# Patient Record
Sex: Male | Born: 1950 | Race: Black or African American | Hispanic: No | Smoking: Never smoker
Health system: Southern US, Community
[De-identification: ages and names within clinical notes are randomized; demographics above are authoritative.]

## PROBLEM LIST (undated history)

## (undated) DIAGNOSIS — I10 Essential (primary) hypertension: Secondary | ICD-10-CM

## (undated) DIAGNOSIS — I509 Heart failure, unspecified: Secondary | ICD-10-CM

## (undated) DIAGNOSIS — E119 Type 2 diabetes mellitus without complications: Secondary | ICD-10-CM

---

## 2015-02-05 ENCOUNTER — Emergency Department (HOSPITAL_COMMUNITY)
Admission: EM | Admit: 2015-02-05 | Discharge: 2015-02-05 | Disposition: A | Discharge status: Home / Self Care | Payer: Self-pay | Attending: Emergency Medicine | Admitting: Emergency Medicine

## 2015-02-05 ENCOUNTER — Encounter (HOSPITAL_COMMUNITY): Payer: Self-pay | Admitting: Adult Health

## 2015-02-05 ENCOUNTER — Emergency Department (HOSPITAL_COMMUNITY): Payer: Self-pay

## 2015-02-05 DIAGNOSIS — Z794 Long term (current) use of insulin: Secondary | ICD-10-CM | POA: Insufficient documentation

## 2015-02-05 DIAGNOSIS — I10 Essential (primary) hypertension: Secondary | ICD-10-CM | POA: Insufficient documentation

## 2015-02-05 DIAGNOSIS — Z7982 Long term (current) use of aspirin: Secondary | ICD-10-CM | POA: Insufficient documentation

## 2015-02-05 DIAGNOSIS — Z79899 Other long term (current) drug therapy: Secondary | ICD-10-CM | POA: Insufficient documentation

## 2015-02-05 DIAGNOSIS — R51 Headache: Secondary | ICD-10-CM | POA: Insufficient documentation

## 2015-02-05 DIAGNOSIS — E119 Type 2 diabetes mellitus without complications: Secondary | ICD-10-CM | POA: Insufficient documentation

## 2015-02-05 DIAGNOSIS — I509 Heart failure, unspecified: Secondary | ICD-10-CM | POA: Insufficient documentation

## 2015-02-05 DIAGNOSIS — R519 Headache, unspecified: Secondary | ICD-10-CM

## 2015-02-05 HISTORY — DX: Heart failure, unspecified: I50.9

## 2015-02-05 HISTORY — DX: Type 2 diabetes mellitus without complications: E11.9

## 2015-02-05 HISTORY — DX: Essential (primary) hypertension: I10

## 2015-02-05 MED ORDER — MORPHINE SULFATE 4 MG/ML IJ SOLN
4.0000 mg | Freq: Once | INTRAMUSCULAR | Status: AC
  Administered 2015-02-05: 4 mg via INTRAVENOUS
  Filled 2015-02-05: qty 1

## 2015-02-05 NOTE — ED Provider Notes (Signed)
CSN: [id number]     Arrival date & time 02/05/15  2054 History   First MD Initiated Contact with Patient 02/05/15 2121     Chief Complaint  Patient presents with  . Headache     (Consider location/radiation/quality/duration/timing/severity/associated sxs/prior Treatment) HPI  Pt is a 64yo male with hx of HTN, DM, and CHF, presenting to ED with c/o sudden onset "thunderclap" headache that started around 8:45PM while visiting a family member. Pt states pain is constant, aching and sharp, was 10/10 at first but now 8/10 w/o pain medication. Pt states he does not have a hx of migraines and has never had a HA like this before. Denies recent falls or head trauma. Pt is on daily aspirin but no other blood thinners. No recent cough, congestion, fever or chills. Pt denies n/v/d.  Denies hx of seizures.    Past Medical History  Diagnosis Date  . Hypertension   . Diabetes mellitus without complication   . CHF (congestive heart failure)    History reviewed. No pertinent past surgical history. History reviewed. No pertinent family history. History  Substance Use Topics  . Smoking status: Never Smoker   . Smokeless tobacco: Not on file  . Alcohol Use: No    Review of Systems  Constitutional: Negative for fever and chills.  Eyes: Negative for photophobia, pain and visual disturbance.  Respiratory: Negative for cough and shortness of breath.   Cardiovascular: Negative for chest pain, palpitations and leg swelling.  Gastrointestinal: Negative for nausea, vomiting and abdominal pain.  Neurological: Positive for headaches. Negative for dizziness, tremors, seizures, syncope, facial asymmetry, speech difficulty, weakness, light-headedness and numbness.  All other systems reviewed and are negative.     Allergies  Review of patient's allergies indicates no known allergies.  Home Medications   Prior to Admission medications   Medication Sig Start Date End Date Taking? Authorizing Provider   acetaminophen (TYLENOL) 500 MG tablet Take 500 mg by mouth every 6 (six) hours as needed for headache.   Yes Historical Provider, MD  allopurinol (ZYLOPRIM) 100 MG tablet Take 100 mg by mouth every morning.   Yes Historical Provider, MD  aspirin 81 MG chewable tablet Chew 81 mg by mouth daily. 11/13/14  Yes Historical Provider, MD  carvedilol (COREG) 12.5 MG tablet Take 12.5 mg by mouth 2 (two) times daily. 11/13/14  Yes Historical Provider, MD  ferrous sulfate 325 (65 FE) MG tablet Take 1 tablet by mouth 2 (two) times daily.   Yes Historical Provider, MD  glipiZIDE (GLUCOTROL) 10 MG tablet Take 10 mg by mouth 2 (two) times daily.   Yes Historical Provider, MD  hydrALAZINE (APRESOLINE) 50 MG tablet Take 50 mg by mouth 3 (three) times daily. 05/21/13  Yes Historical Provider, MD  insulin aspart (NOVOLOG) 100 UNIT/ML injection Inject 0-10 Units into the skin 3 (three) times daily before meals.   Yes Historical Provider, MD  insulin glargine (LANTUS) 100 UNIT/ML injection Inject 10 Units into the skin at bedtime.   Yes Historical Provider, MD  isosorbide mononitrate (IMDUR) 60 MG 24 hr tablet Take 60 mg by mouth every morning.   Yes Historical Provider, MD  nitroGLYCERIN (NITROSTAT) 0.4 MG SL tablet Take 0.4 mg by mouth every 5 (five) minutes x 3 doses as needed. 11/11/14  Yes Historical Provider, MD  Omeprazole 20 MG TBEC Take 20 mg by mouth daily.   Yes Historical Provider, MD  traMADol (ULTRAM) 50 MG tablet Take 100 mg by mouth every 6 (six) hours as  needed for severe pain.   Yes Historical Provider, MD  Vitamin D, Ergocalciferol, (DRISDOL) 50000 UNITS CAPS capsule Take 50,000 Units by mouth 2 (two) times a week. Takes on mon and fri   Yes Historical Provider, MD   BP 142/78 mmHg  Pulse 72  Temp(Src) 97.9 F (36.6 C) (Oral)  Resp 15  SpO2 98% Physical Exam  Constitutional: He is oriented to person, place, and time. He appears well-developed and well-nourished.  Pt lying in exam bed, NAD. Eyes  closed.  HENT:  Head: Normocephalic and atraumatic.  Eyes: Conjunctivae and EOM are normal. Pupils are equal, round, and reactive to light. No scleral icterus.  Neck: Normal range of motion. Neck supple.  No nuchal rigidity or meningeal signs.  Cardiovascular: Normal rate, regular rhythm and normal heart sounds.   Pulmonary/Chest: Effort normal and breath sounds normal. No respiratory distress. He has no wheezes. He has no rales. He exhibits no tenderness.  Abdominal: Soft. Bowel sounds are normal. He exhibits no distension and no mass. There is no tenderness. There is no rebound and no guarding.  Musculoskeletal: Normal range of motion.  Neurological: He is alert and oriented to person, place, and time. He has normal strength. No cranial nerve deficit or sensory deficit. Coordination normal. GCS eye subscore is 4. GCS verbal subscore is 5. GCS motor subscore is 6.  CN II-XII in tact. Speech is fluent and clear, able to follow 2 step commands. Sensation symmetric in all extremities bilaterally. 5/5 strength in upper and lower extremities bilaterally.   Skin: Skin is warm and dry.  Nursing note and vitals reviewed.   ED Course  Procedures (including critical care time) Labs Review Labs Reviewed - No data to display  Imaging Review Ct Head Wo Contrast  02/05/2015   CLINICAL DATA:  Severe posterior headache beginning 20 minutes ago. History of hypertension, diabetes and CHF.  EXAM: CT HEAD WITHOUT CONTRAST  TECHNIQUE: Contiguous axial images were obtained from the base of the skull through the vertex without intravenous contrast.  COMPARISON:  None.  FINDINGS: The ventricles and sulci are normal for age. No intraparenchymal hemorrhage, mass effect nor midline shift. Patchy supratentorial white matter hypodensities are within normal range for patient's age and though non-specific suggest sequelae of chronic small vessel ischemic disease. No acute large vascular territory infarcts. Chronic  appearing small RIGHT cerebellar infarct.  No abnormal extra-axial fluid collections. Basal cisterns are patent. Mild calcific atherosclerosis of the carotid siphons.  No skull fracture. The included ocular globes and orbital contents are non-suspicious. The mastoid aircells and included paranasal sinuses are well-aerated.  IMPRESSION: No acute intracranial process.  Mild white matter changes suggest chronic small vessel ischemic disease. Chronic appearing small RIGHT cerebellar infarct.   Electronically Signed   By: Awilda Metro   On: 02/05/2015 21:59     EKG Interpretation None      MDM   Final diagnoses:  Generalized headache   Pt is a 64yo male presenting to ED with c/o sudden onset "thunderclap" headache at 8:45PM.  Pt was in hospital visiting a family member. Denies recent head trauma. Vitals: WNL. No focal neuro deficit on exam. Concern for [facility].   CT head: no acute intracranial process. Pt was given IV  morphine. HA improved significantly from 10/10 to 3/10 Pt was able to ambulate around ED w/o difficulty. Still no focal neuro deficit.  Discussed pt with Dr. Anitra Lauth, no evidence of emergent process taking place at this time. No indication for additional  work up. Pt may be discharged home to f/u with PCP. Return precautions provided. Pt verbalized understanding and agreement with tx plan.   CT head:   Junius Finnerrin O'Malley, PA-C 02/05/15 2305  Gwyneth SproutWhitney Plunkett, MD 02/06/15 [phone]

## 2015-02-05 NOTE — ED Notes (Signed)
Presents with sudden onset of headache-described as "thunderclap in head" no neuro deficits at this time-alert and oriented- pt has equal reactive pupils. BP WNL.  Headache come on at 8:45 pm this evening.

## 2016-12-12 IMAGING — CT CT HEAD W/O CM
2 series · 15 of 30 positions shown, 19 images · non-contrast
Comparison: None.

CLINICAL DATA: Severe posterior headache beginning 20 minutes ago.
History of hypertension, diabetes and CHF.

EXAM:
CT HEAD WITHOUT CONTRAST
TECHNIQUE: Contiguous axial images were obtained from the base of the skull
through the vertex without intravenous contrast.

[Series 201: head w/o, idose (1) · axial · non-contrast · 0.49mm/px · z∈[+68,+198]mm · 13 of 32 slices shown, 17 images]
[im 3/32  brain]
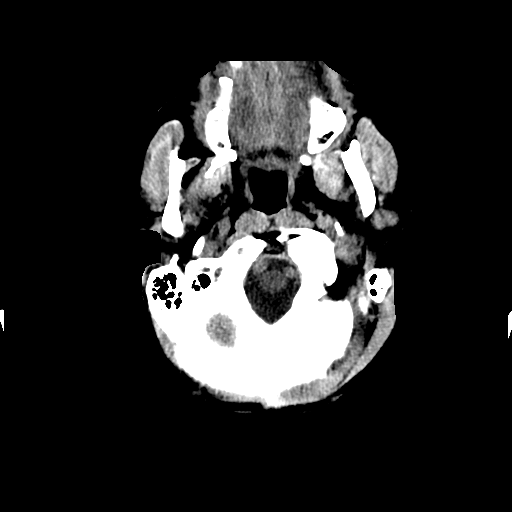
[im 3/32  bone]
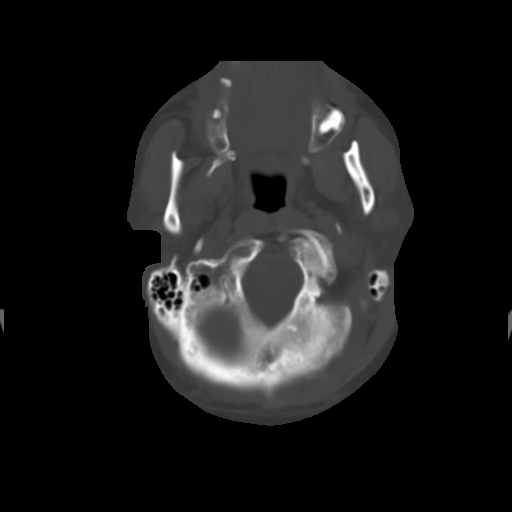
[im 5/32  brain]
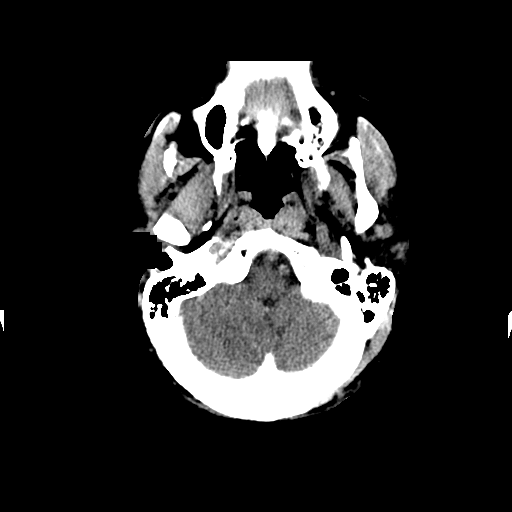
[im 7/32  brain]
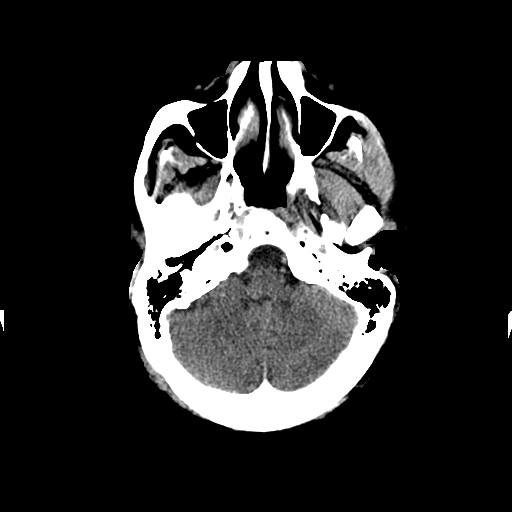
[im 9/32  brain]
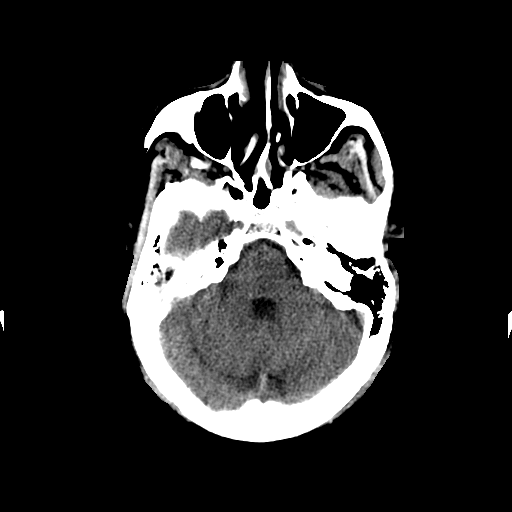
[im 12/32  brain]
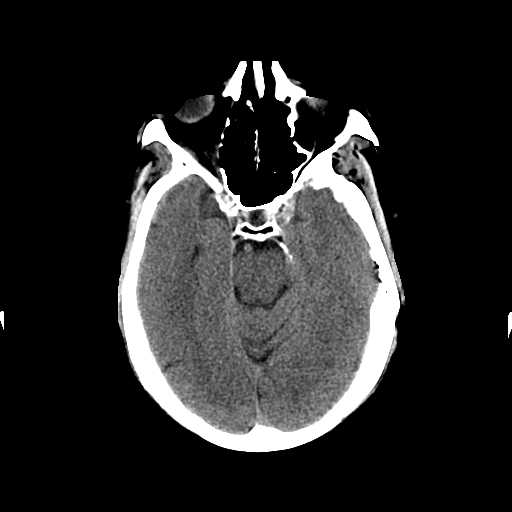
[im 12/32  bone]
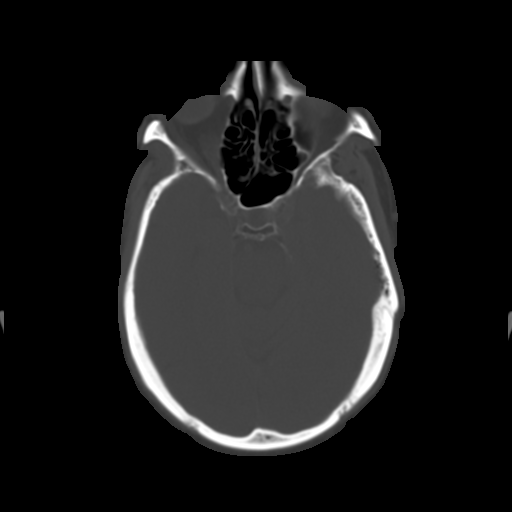
[im 14/32  brain]
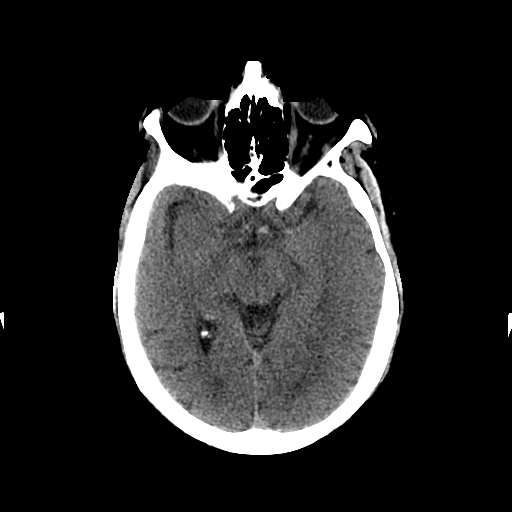
[im 16/32  brain]
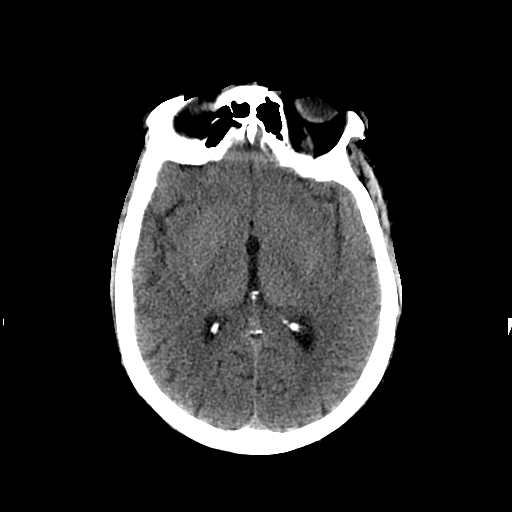
[im 18/32  brain]
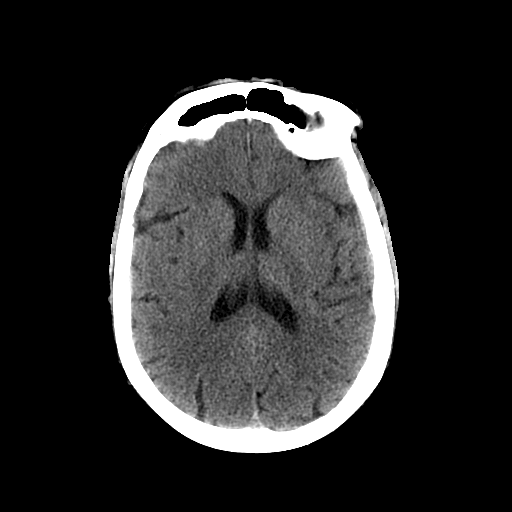
[im 20/32  brain]
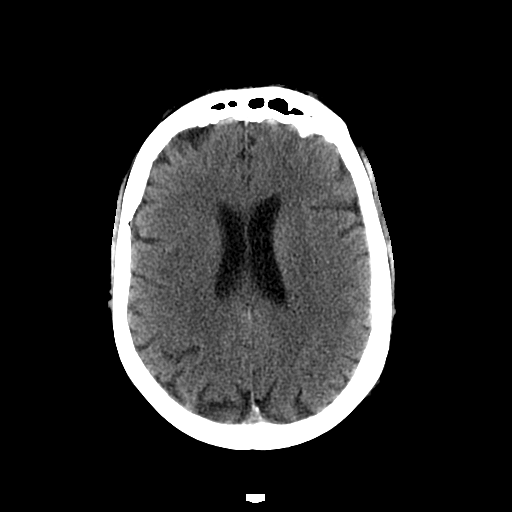
[im 20/32  bone]
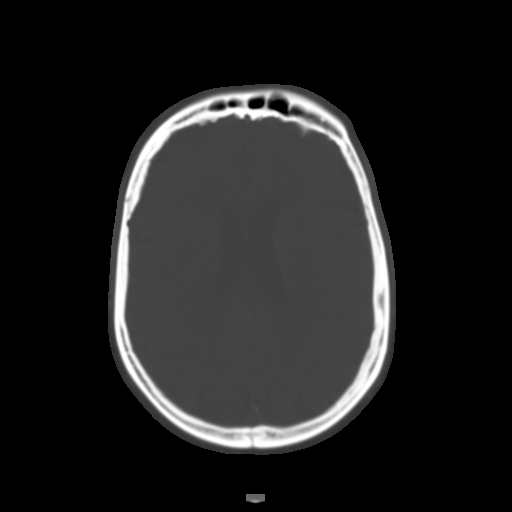
[im 23/32  brain]
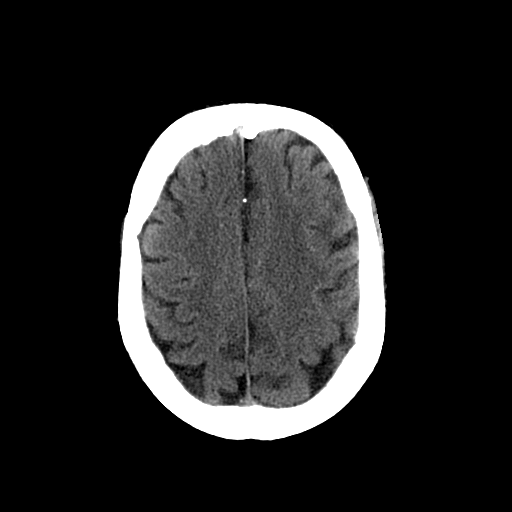
[im 25/32  brain]
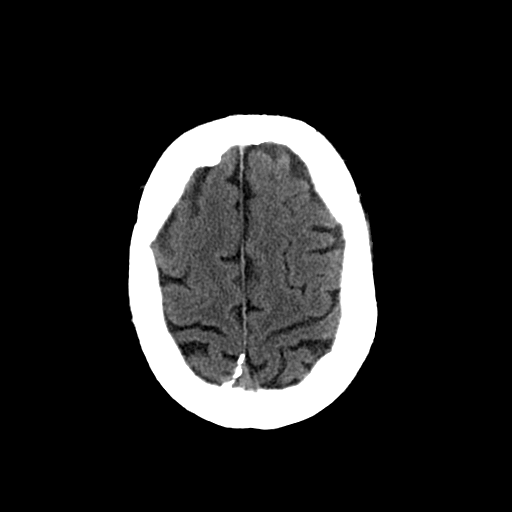
[im 27/32  brain]
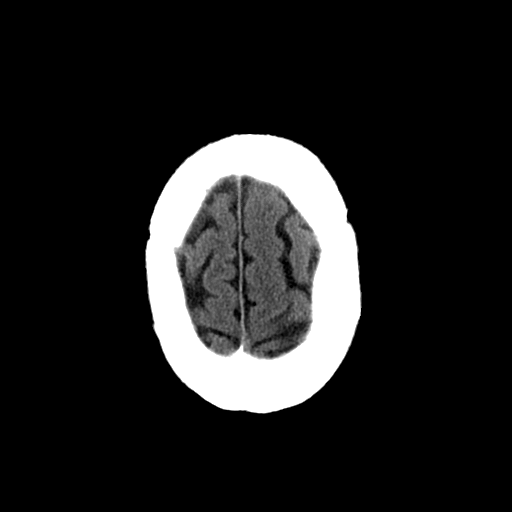
[im 29/32  brain]
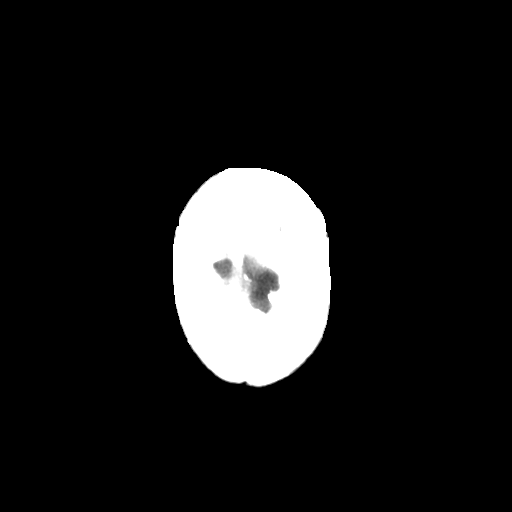
[im 29/32  bone]
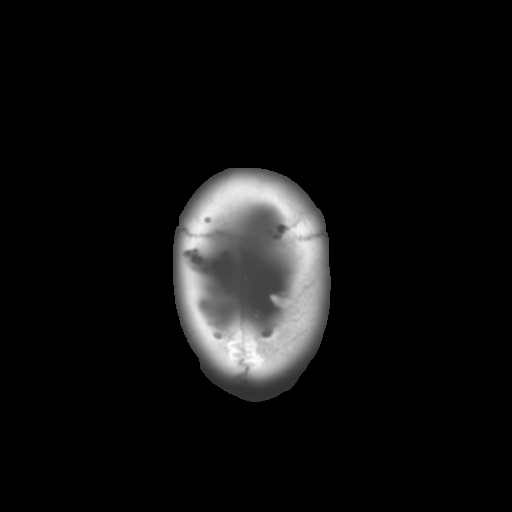

[Series 202: head w/o bone, idose (1) · axial · non-contrast · 0.49mm/px · z∈[+68,+88]mm · 2 of 32 slices shown]
[im 3/32  bone]
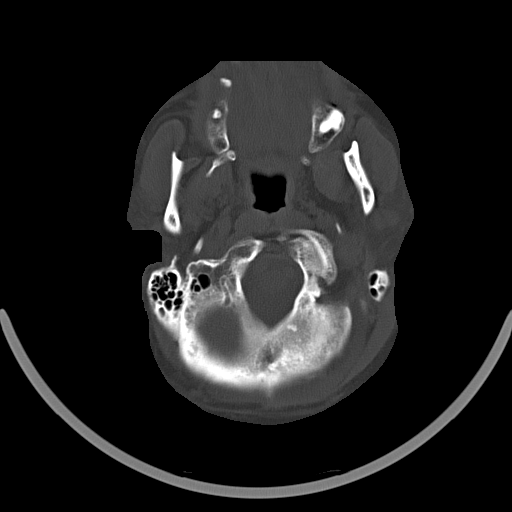
[im 7/32  bone]
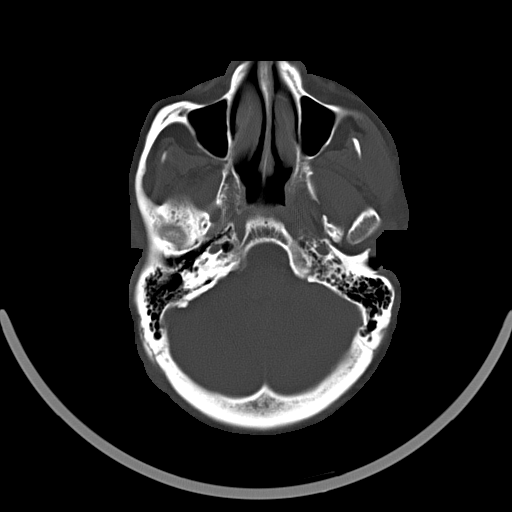

[15 of 30 positions shown; findings below may reference images not displayed]

FINDINGS: The ventricles and sulci are normal for age. No intraparenchymal
hemorrhage, mass effect nor midline shift. Patchy supratentorial
white matter hypodensities are within normal range for patient's age
and though non-specific suggest sequelae of chronic small vessel
ischemic disease. No acute large vascular territory infarcts.
Chronic appearing small RIGHT cerebellar infarct.

No abnormal extra-axial fluid collections. Basal cisterns are
patent. Mild calcific atherosclerosis of the carotid siphons.

No skull fracture. The included ocular globes and orbital contents
are non-suspicious. The mastoid aircells and included paranasal
sinuses are well-aerated.
IMPRESSION: No acute intracranial process.

Mild white matter changes suggest chronic small vessel ischemic
disease. Chronic appearing small RIGHT cerebellar infarct.

By: Luz Victoria F Ch

## 2023-04-20 DEATH — deceased
